# Patient Record
Sex: Male | Born: 2000 | Race: White | Hispanic: No | Marital: Single | Smoking: Never smoker
Health system: Southern US, Community
[De-identification: ages and names within clinical notes are randomized; demographics above are authoritative.]

---
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
## 2020-02-14 ENCOUNTER — Ambulatory Visit
Admission: EM | Admit: 2020-02-14 | Discharge: 2020-02-14 | Disposition: A | Discharge status: Home / Self Care | Payer: No Typology Code available for payment source | Attending: Family Medicine | Admitting: Family Medicine

## 2020-02-14 ENCOUNTER — Ambulatory Visit (INDEPENDENT_AMBULATORY_CARE_PROVIDER_SITE_OTHER): Payer: No Typology Code available for payment source

## 2020-02-14 ENCOUNTER — Other Ambulatory Visit: Payer: Self-pay

## 2020-02-14 DIAGNOSIS — S92355A Nondisplaced fracture of fifth metatarsal bone, left foot, initial encounter for closed fracture: Secondary | ICD-10-CM

## 2020-02-14 MED ORDER — HYDROCODONE-ACETAMINOPHEN 5-325 MG PO TABS: 1.0000 | ORAL_TABLET | Freq: Three times a day (TID) | ORAL | 0 refills | Status: DC | PRN

## 2020-02-14 MED ORDER — MELOXICAM 15 MG PO TABS: 15.0000 mg | ORAL_TABLET | Freq: Every day | ORAL | 0 refills | Status: DC

## 2020-02-14 NOTE — ED Provider Notes (Addendum)
[facility]    CSN: [id number] Arrival date & time: 02/14/20  1735 History   Chief Complaint Chief Complaint  Patient presents with  . Foot Pain   HPI   19 year old male presents with left foot pain and associated swelling of the foot and ankle.  Patient reports that he was playing basketball yesterday.  He states that he rolled his ankle/foot.  He states that he is having significant pain and swelling.  He localizes the pain to the lateral aspect of the foot.  He states that he is unable to bear weight.  Pain 7/10 in severity.  No relieving factors.  No other reported injuries.  No other complaints or concerns at this time.  Home Medications    Prior to Admission medications   Medication Sig Start Date End Date Taking? Authorizing Provider  HYDROcodone-acetaminophen (NORCO/VICODIN) 5-325 MG tablet Take 1 tablet by mouth every 8 (eight) hours as needed for moderate pain. 02/14/20   Coral Spikes, DO  meloxicam (MOBIC) 15 MG tablet Take 1 tablet (15 mg total) by mouth daily. 02/14/20   Coral Spikes, DO    Social History Social History   Tobacco Use  . Smoking status: Never Smoker  . Smokeless tobacco: Never Used  Substance Use Topics  . Alcohol use: Not Currently  . Drug use: Not Currently     Allergies   Patient has no known allergies.   Review of Systems Review of Systems  Constitutional: Negative.   Musculoskeletal:       Left foot pain and swelling; left ankle swelling.   Physical Exam Triage Vital Signs ED Triage Vitals  Enc Vitals Group     BP 02/14/20 1808 124/71     Pulse Rate 02/14/20 1808 (!) 59     Resp 02/14/20 1808 16     Temp 02/14/20 1808 97.9 F (36.6 C)     Temp Source 02/14/20 1808 Oral     SpO2 02/14/20 1808 99 %     Weight 02/14/20 1809 270 lb (122.5 kg)     Height 02/14/20 1809 6\' 2"  (1.88 m)     Head Circumference --      Peak Flow --      Pain Score 02/14/20 1808 6     Pain Loc --      Pain Edu? --      Excl. in [place]? --     Updated Vital Signs BP 124/71 (BP Location: Right Arm)   Pulse (!) 59   Temp 97.9 F (36.6 C) (Oral)   Resp 16   Ht 6\' 2"  (1.88 m)   Wt 122.5 kg   SpO2 99%   BMI 34.67 kg/m   Visual Acuity Right Eye Distance:   Left Eye Distance:   Bilateral Distance:    Right Eye Near:   Left Eye Near:    Bilateral Near:     Physical Exam Vitals and nursing note reviewed.  Constitutional:      General: He is not in acute distress.    Appearance: Normal appearance. He is obese. He is not ill-appearing.  HENT:     Head: Normocephalic and atraumatic.  Eyes:     General:        Right eye: No discharge.        Left eye: No discharge.     Conjunctiva/sclera: Conjunctivae normal.  Pulmonary:     Effort: Pulmonary effort is normal. No respiratory distress.  Musculoskeletal:     Comments:  Left ankle -lateral swelling noted.  No discrete areas of tenderness over the lateral aspect of the ankle.  Left foot -swelling noted laterally.  Exquisite tenderness over the fifth metatarsal.  Neurological:     Mental Status: He is alert.  Psychiatric:        Mood and Affect: Mood normal.        Behavior: Behavior normal.    UC Treatments / Results  Labs (all labs ordered are listed, but only abnormal results are displayed) Labs Reviewed - No data to display  EKG   Radiology DG Ankle Complete Left  Result Date: 02/14/2020 CLINICAL DATA:  Basketball injury yesterday with pain and swelling, initial encounter EXAM: LEFT ANKLE COMPLETE - 3+ VIEW COMPARISON:  None. FINDINGS: Soft tissue swelling is noted slightly greater laterally than medially. A tiny bony density is noted adjacent to the lateral malleolus which may represent a tiny avulsion. A an undisplaced fracture is noted at the base of the fifth metatarsal best seen on the lateral projection. No other focal abnormality is noted. IMPRESSION: Fracture at the base of the fifth metatarsal as well as findings suspicious for small avulsion from the  lateral malleolus. Associated soft tissue changes are seen. Electronically Signed   By: Alcide Clever M.D.   On: 02/14/2020 18:56   DG Foot Complete Left  Result Date: 02/14/2020 CLINICAL DATA:  Recent basketball injury with lateral foot pain, initial encounter EXAM: LEFT FOOT - COMPLETE 3+ VIEW COMPARISON:  None. FINDINGS: Fracture at the base of the fifth metatarsal is noted. No other fracture is seen. Lateral soft tissue swelling is noted. IMPRESSION: Fifth metatarsal fracture with soft tissue swelling. Electronically Signed   By: Alcide Clever M.D.   On: 02/14/2020 18:57    Procedures Procedures (including critical care time)  Medications Ordered in UC Medications - No data to display  Initial Impression / Assessment and Plan / UC Course  I have reviewed the triage vital signs and the nursing notes.  Pertinent labs & imaging results that were available during my care of the patient were reviewed by me and considered in my medical decision making (see chart for details).    19 year old male presents with a fracture of the fifth metatarsal and likely avulsion fracture of the lateral malleolus.  Acute complicated injury.  Placed in posterior splint.  Nonweightbearing.  Vicodin as needed for pain.  Meloxicam as directed.  Needs orthopedic follow-up.  Information given.  Final Clinical Impressions(s) / UC Diagnoses   Final diagnoses:  Closed nondisplaced fracture of fifth metatarsal bone of left foot, initial encounter     Discharge Instructions     Rest, elevation.  No weight bearing.  Medications as directed.  Please call [facility] clinic Orthopedics [phone]) OR [facility] [phone]) for an appt.  Take care  Dr. Adriana Simas     ED Prescriptions    Medication Sig Dispense Auth. Provider   meloxicam (MOBIC) 15 MG tablet Take 1 tablet (15 mg total) by mouth daily. 14 tablet Jesslynn Kruck G, DO   HYDROcodone-acetaminophen (NORCO/VICODIN) 5-325 MG tablet Take 1 tablet by  mouth every 8 (eight) hours as needed for moderate pain. 10 tablet Everlene Other G, DO     I have reviewed the PDMP during this encounter.    Tommie Sams, [place] 02/14/20 2045

## 2020-02-14 NOTE — Discharge Instructions (Signed)
Rest, elevation.  No weight bearing.  Medications as directed.  Please call [facility] clinic Orthopedics [phone]) OR [facility] [phone]) for an appt.  Take care  Dr. Adriana Simas

## 2020-02-14 NOTE — ED Triage Notes (Signed)
Pt states he was playing basketball yesterday on an uneven surface and twisted his left foot/ankle. Trouble bearing weight today an having lateral foot pain.

## 2021-06-27 IMAGING — CR DG ANKLE COMPLETE 3+V*L*
4 series · 4 of 4 positions shown · non-contrast
Comparison: None.

CLINICAL DATA: Basketball injury yesterday with pain and swelling,
initial encounter

EXAM:
LEFT ANKLE COMPLETE - 3+ VIEW

[ankle ap]
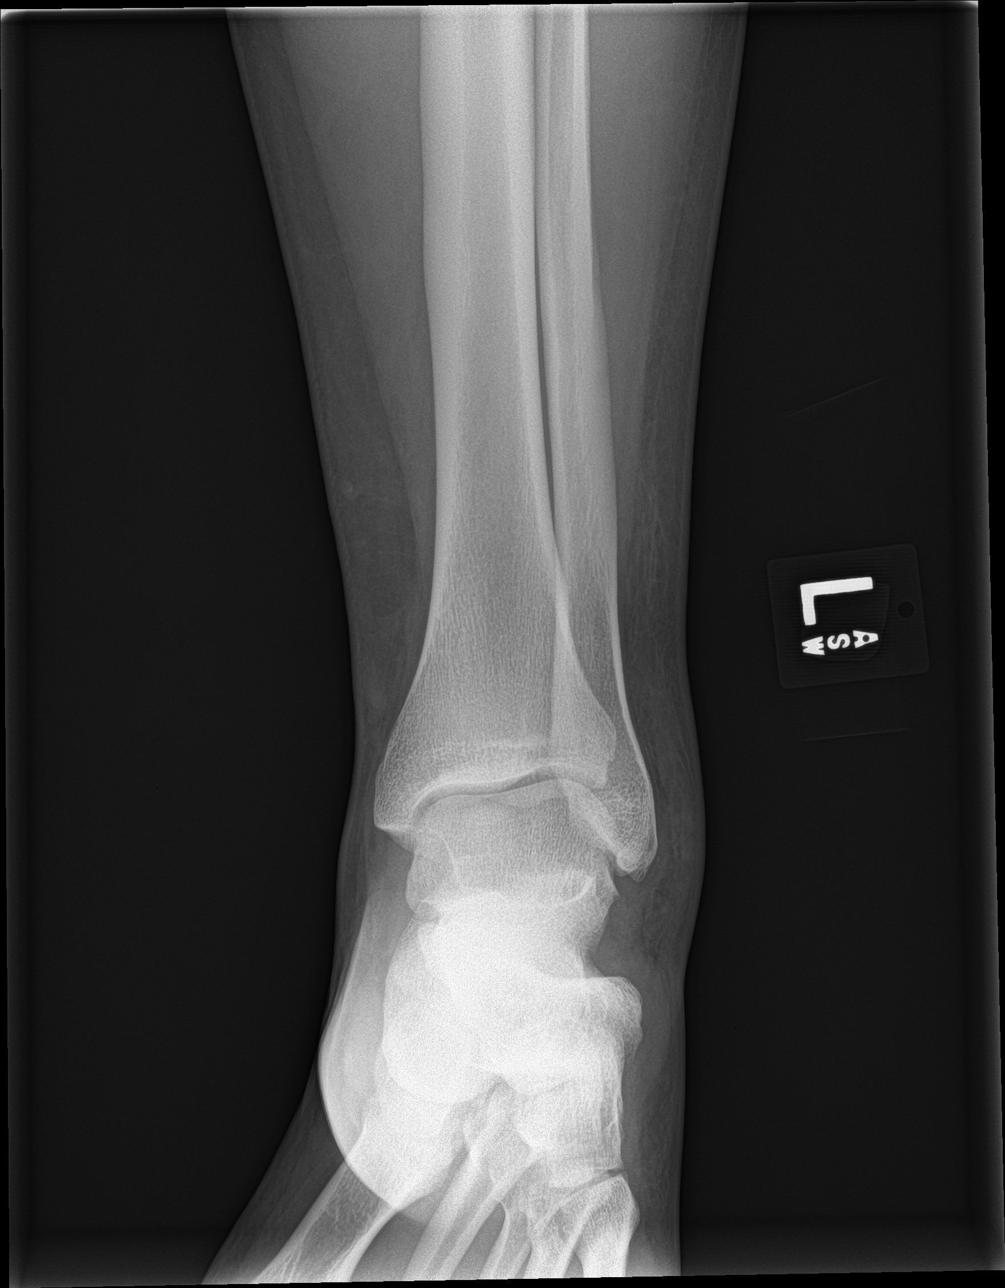

[ankle obl (1 of 2)]
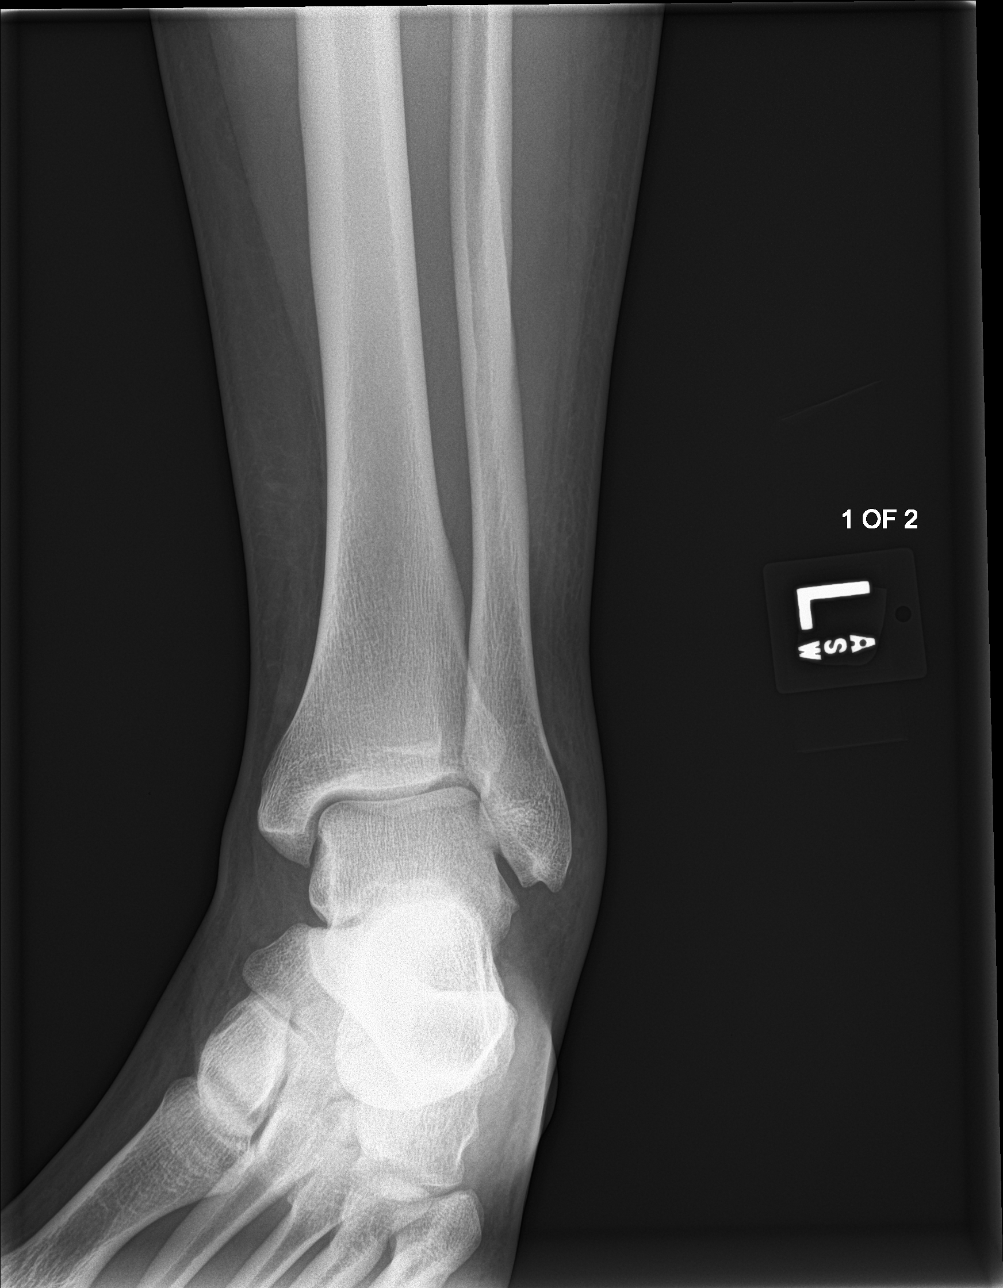

[ankle lat]
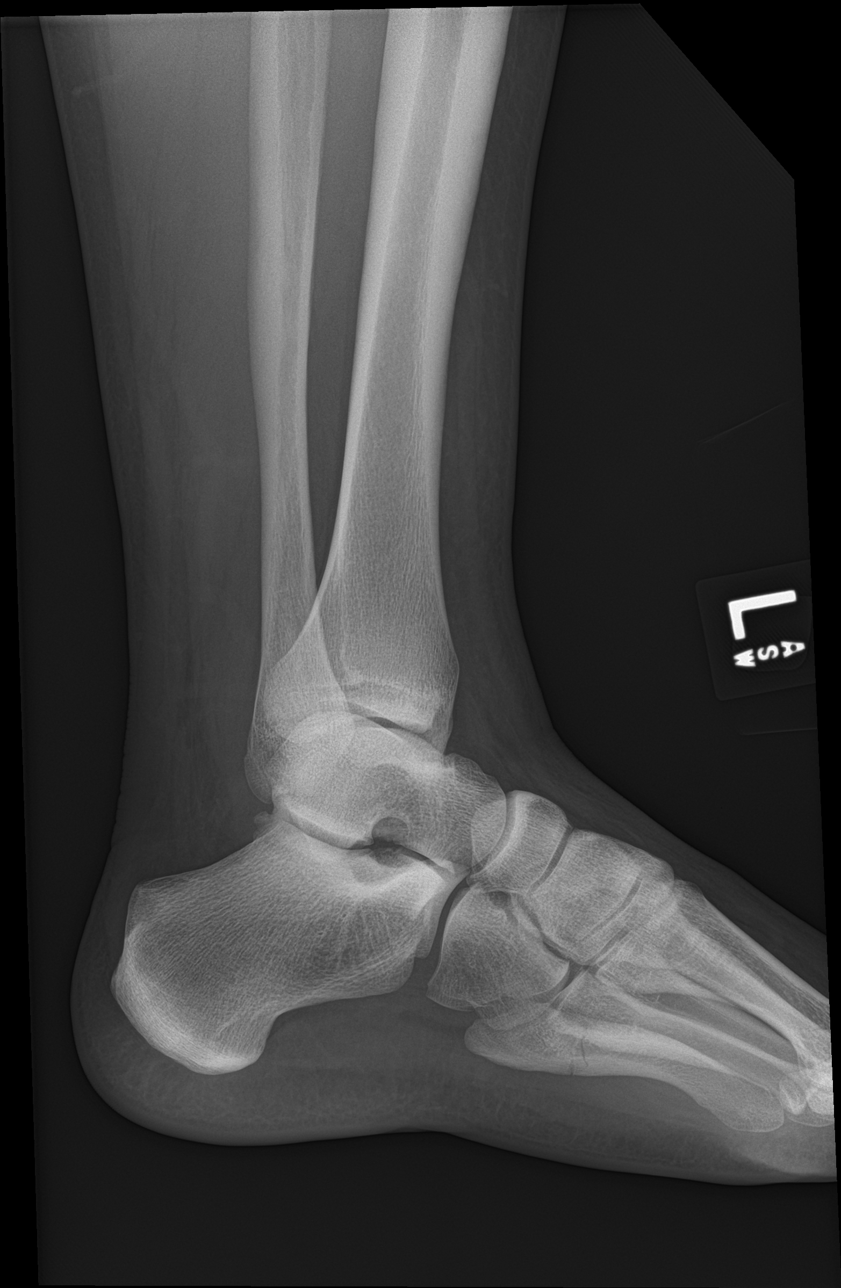

[ankle obl (2 of 2)]
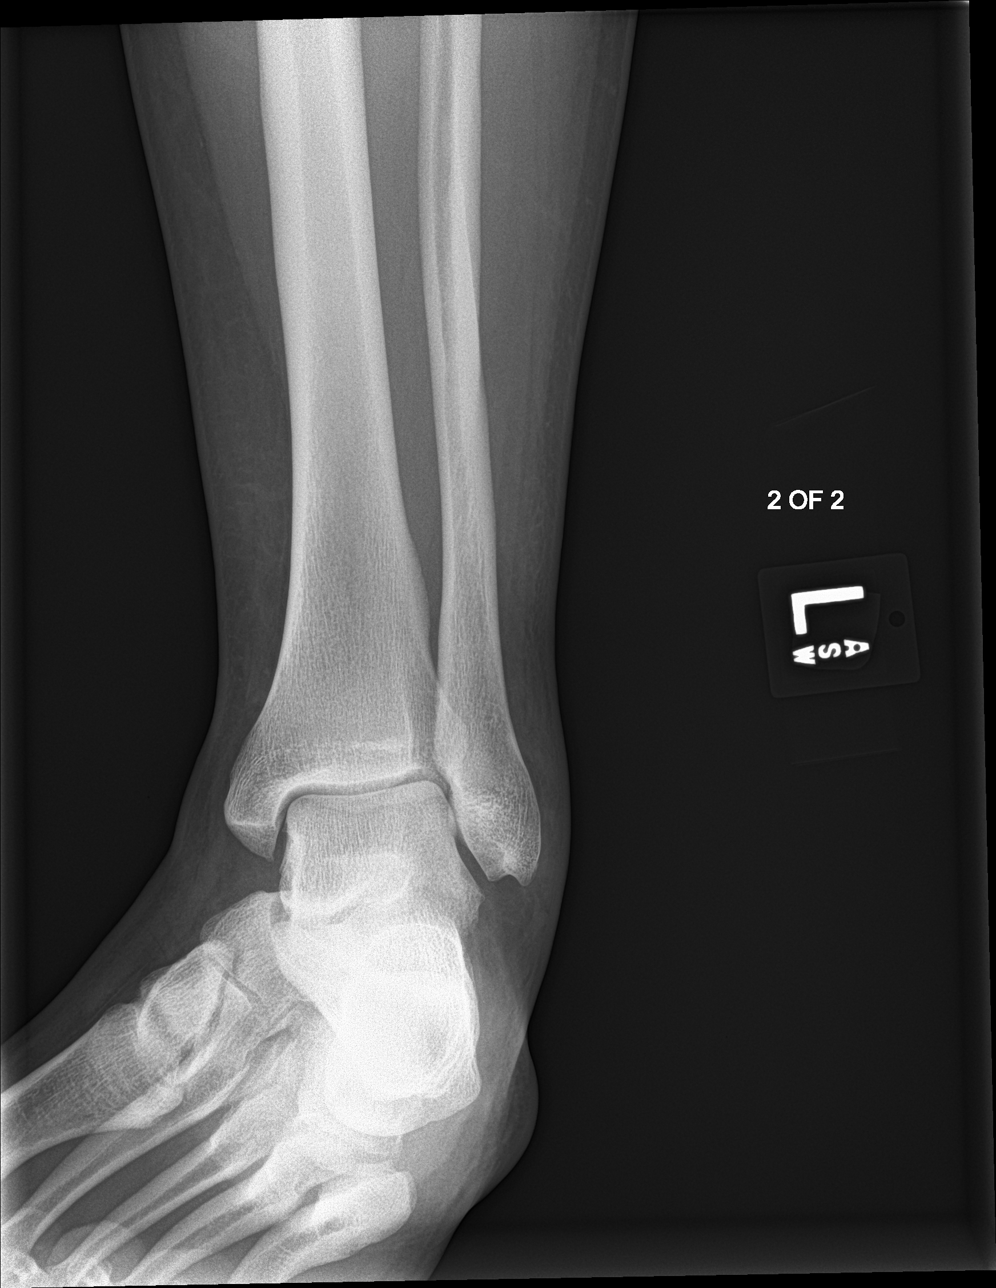

[4 of 4 positions shown; findings below may reference images not displayed]

FINDINGS: Soft tissue swelling is noted slightly greater laterally than
medially. A tiny bony density is noted adjacent to the lateral
malleolus which may represent a tiny avulsion. A an undisplaced
fracture is noted at the base of the fifth metatarsal best seen on
the lateral projection. No other focal abnormality is noted.
IMPRESSION: Fracture at the base of the fifth metatarsal as well as findings
suspicious for small avulsion from the lateral malleolus. Associated
soft tissue changes are seen.

## 2022-11-03 ENCOUNTER — Encounter: Payer: Self-pay | Admitting: Family Medicine

## 2022-11-03 ENCOUNTER — Ambulatory Visit: Payer: Self-pay | Admitting: Family Medicine

## 2022-11-03 DIAGNOSIS — Z113 Encounter for screening for infections with a predominantly sexual mode of transmission: Secondary | ICD-10-CM

## 2022-11-03 LAB — HM HIV SCREENING LAB: HM HIV Screening: NEGATIVE

## 2022-11-03 NOTE — Progress Notes (Signed)
[facility] Department STI clinic/screening visit  Subjective:  Chase Jefferson is a 22 y.o. male being seen today for an STI screening visit. The patient reports they do not have symptoms.    Patient has the following medical conditions:  There are no problems to display for this patient.    Chief Complaint  Patient presents with   Chase Jefferson states he is not having any symptoms     HPI  Patient reports to clinic for STI testing. States he was told a person he had sex with has HIV, states "this is a rumor".   Last HIV test per patient/review of record was No results found for: "HMHIVSCREEN" No results found for: "HIV"  Does the patient or their partner desires a pregnancy in the next year? No  Screening for MPX risk: Does the patient have an unexplained rash? No Is the patient MSM? No Does the patient endorse multiple sex partners or anonymous sex partners? No Did the patient have close or sexual contact with a person diagnosed with MPX? No Has the patient traveled outside the [place] where MPX is endemic? No Is there a high clinical suspicion for MPX-- evidenced by one of the following No  -Unlikely to be chickenpox  -Lymphadenopathy  -Rash that present in same phase of evolution on any given body part   See flowsheet for further details and programmatic requirements.   Immunization History  Administered Date(s) Administered   HPV Quadrivalent 06/04/2010, 08/06/2010, 10/28/2010   Hepatitis A, Ped/Adol-2 Dose 02/25/2006, 09/02/2006   Hepatitis B, PED/ADOLESCENT 08-14-01, 04/14/2001, 11/24/2001   Influenza,inj,Quad PF,6+ Mos 08/22/2019   MMR 02/09/2002, 02/25/2006   Meningococcal B, OMV 08/05/2017, 08/11/2018   Meningococcal Mcv4o 03/16/2012, 08/05/2017   Pneumococcal Conjugate PCV 7 02/09/2002, 05/11/2002   Tdap 07/25/2017   Varicella 02/09/2002, 02/25/2006     The following portions of the patient's history were  reviewed and updated as appropriate: allergies, current medications, past medical history, past social history, past surgical history and problem list.  Objective:  There were no vitals filed for this visit.  Physical Exam Vitals and nursing note reviewed.  Constitutional:      Appearance: Normal appearance.  HENT:     Head: Normocephalic and atraumatic.     Mouth/Throat:     Mouth: Mucous membranes are moist.     Pharynx: No oropharyngeal exudate or posterior oropharyngeal erythema.  Eyes:     General:        Right eye: No discharge.        Left eye: No discharge.     Conjunctiva/sclera:     Right eye: Right conjunctiva is not injected. No exudate.    Left eye: Left conjunctiva is not injected. No exudate. Pulmonary:     Effort: Pulmonary effort is normal.  Abdominal:     General: Abdomen is flat.     Palpations: Abdomen is soft. There is no hepatomegaly or mass.     Tenderness: There is no abdominal tenderness. There is no rebound.  Genitourinary:    Comments: Declined genital exam- no symptoms Lymphadenopathy:     Cervical: No cervical adenopathy.     Upper Body:     Right upper body: No supraclavicular or axillary adenopathy.     Left upper body: No supraclavicular or axillary adenopathy.  Skin:    General: Skin is warm and dry.  Neurological:     Mental Status: He is alert and oriented to person, place, and  time.       Assessment and Plan:  Chase Jefferson is a 22 y.o. male presenting to the [facility] Department for STI screening  1. Screening for venereal disease  - HIV [facility] - Syphilis Serology, [facility] - Chlamydia/GC NAA, Confirmation   Patient does not have STI symptoms Patient accepted all screenings including   Patient meets criteria for HepB screening? No. Ordered? not applicable Patient meets criteria for HepC screening? No. Ordered? not applicable Recommended condom use with all sex Discussed importance of condom use for  STI prevent  Treat per standing order Discussed time line for [facility] results and that patient will be called with positive results and encouraged patient to call if he had not heard in 2 weeks Recommended returning for continued or worsening symptoms.   Return if symptoms worsen or fail to improve.  Total time spent 15 minutes  Chase Jefferson, [place]

## 2022-11-03 NOTE — Progress Notes (Signed)
Pt is here for STD screening.  Condoms given.  Windle Guard, RN

## 2022-11-12 ENCOUNTER — Telehealth: Payer: Self-pay | Admitting: Family Medicine

## 2022-11-12 ENCOUNTER — Ambulatory Visit: Payer: Self-pay

## 2022-11-12 DIAGNOSIS — A749 Chlamydial infection, unspecified: Secondary | ICD-10-CM

## 2022-11-12 LAB — CHLAMYDIA/GC NAA, CONFIRMATION
Chlamydia trachomatis, NAA: POSITIVE — AB
Neisseria gonorrhoeae, NAA: NEGATIVE

## 2022-11-12 LAB — C. TRACHOMATIS NAA, CONFIRM: C. trachomatis NAA, Confirm: POSITIVE — AB

## 2022-11-12 MED ORDER — DOXYCYCLINE HYCLATE 100 MG PO TABS: 100.0000 mg | ORAL_TABLET | Freq: Two times a day (BID) | ORAL | 0 refills | Status: AC

## 2022-11-12 NOTE — Telephone Encounter (Signed)
Patient and Mother called concerned about his STI results. Would like someone to call back today if possible to talk about the results and if he needs treatment. Thank you!

## 2022-11-12 NOTE — Progress Notes (Signed)
Pt is here for treatment of Chlamydia.  The patient was dispensed Doxycycline 100 mg #14 today. I provided counseling today regarding the medication. We discussed the medication, the side effects and when to call clinic. Patient given the opportunity to ask questions. Questions answered.  Pt given Chlamydia brochure and condoms.  Windle Guard, RN

## 2024-03-07 ENCOUNTER — Ambulatory Visit
Admission: EM | Admit: 2024-03-07 | Discharge: 2024-03-07 | Disposition: A | Discharge status: Home / Self Care | Attending: Emergency Medicine | Admitting: Emergency Medicine

## 2024-03-07 DIAGNOSIS — H5789 Other specified disorders of eye and adnexa: Secondary | ICD-10-CM | POA: Diagnosis not present

## 2024-03-07 DIAGNOSIS — H1031 Unspecified acute conjunctivitis, right eye: Secondary | ICD-10-CM | POA: Diagnosis not present

## 2024-03-07 MED ORDER — POLYMYXIN B-TRIMETHOPRIM 10000-0.1 UNIT/ML-% OP SOLN: 1.0000 [drp] | Freq: Four times a day (QID) | OPHTHALMIC | 0 refills | Status: AC

## 2024-03-07 NOTE — ED Provider Notes (Signed)
 Chase Jefferson    CSN: [id number] Arrival date & time: 03/07/24  1444      History   Chief Complaint Chief Complaint  Patient presents with   Eye Problem    HPI Chase Jefferson is a 23 y.o. male.  Patient presents with right eye irritation, redness, tearing since yesterday.  He denies eye pain or change in vision.  No purulent drainage.  No eye trauma.  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  He sprays the pesticide and was sweating a lot.  He is concerned that the sweat may have carried some pesticide into his eye.  He wears safety goggles at work.  No sensation of foreign body.  No fever or chills.  Patient wears contact lenses.  The history is provided by the patient and medical records.    History reviewed. No pertinent past medical history.  There are no active problems to display for this patient.   History reviewed. No pertinent surgical history.     Home Medications    Prior to Admission medications   Medication Sig Start Date End Date Taking? Authorizing Provider  trimethoprim-polymyxin b (POLYTRIM) ophthalmic solution Place 1 drop into the right eye 4 (four) times daily for 7 days. 03/07/24 03/14/24 Yes Wellington Half, NP    Family History History reviewed. No pertinent family history.  Social History Social History   Tobacco Use   Smoking status: Never   Smokeless tobacco: Never  Vaping Use   Vaping status: Every Day  Substance Use Topics   Alcohol use: Not Currently   Drug use: Not Currently     Allergies   Patient has no known allergies.   Review of Systems Review of Systems  Constitutional:  Negative for chills and fever.  Eyes:  Positive for discharge and redness. Negative for pain and visual disturbance.  Skin:  Negative for color change, rash and wound.     Physical Exam Triage Vital Signs ED Triage Vitals  Encounter Vitals Group     BP 03/07/24 1521  134/82     Systolic BP Percentile --      Diastolic BP Percentile --      Pulse Rate 03/07/24 1521 (!) 56     Resp 03/07/24 1521 18     Temp 03/07/24 1521 (!) 97.1 F (36.2 C)     Temp src --      SpO2 03/07/24 1521 98 %     Weight --      Height --      Head Circumference --      Peak Flow --      Pain Score 03/07/24 1514 0     Pain Loc --      Pain Education --      Exclude from Growth Chart --    No data found.  Updated Vital Signs BP 134/82   Pulse (!) 56   Temp (!) 97.1 F (36.2 C)   Resp 18   SpO2 98%   Visual Acuity Right Eye Distance: 20/50 Left Eye Distance: 20/30 Bilateral Distance: 20/40  Right Eye Near:   Left Eye Near:    Bilateral Near:     Physical Exam Constitutional:      General: He is not in acute distress. HENT:     Mouth/Throat:     Mouth: Mucous membranes are moist.  Eyes:     General: Lids are  normal. Vision grossly intact.     Extraocular Movements: Extraocular movements intact.     Conjunctiva/sclera:     Right eye: Right conjunctiva is injected.     Left eye: Left conjunctiva is not injected.     Pupils: Pupils are equal, round, and reactive to light.     Right eye: No fluorescein uptake.  Cardiovascular:     Rate and Rhythm: Normal rate and regular rhythm.  Pulmonary:     Effort: Pulmonary effort is normal. No respiratory distress.  Neurological:     Mental Status: He is alert.      UC Treatments / Results  Labs (all labs ordered are listed, but only abnormal results are displayed) Labs Reviewed - No data to display  EKG   Radiology No results found.  Procedures Procedures (including critical care time)  Medications Ordered in UC Medications - No data to display  Initial Impression / Assessment and Plan / UC Course  I have reviewed the triage vital signs and the nursing notes.  Pertinent labs & imaging results that were available during my care of the patient were reviewed by me and considered in my medical  decision making (see chart for details).   Conjunctivitis and irritation of right eye.  Afebrile and vital signs are stable.  Patient removed his contact lenses here in the office.  His girlfriend will drive him home.  No fluorescein uptake noted on exam.  Treating with Polytrim eyedrops.  Instructed patient to follow-up with his eye care provider tomorrow.  ED precautions given.  Instructed patient to wear his glasses until he is cleared for wearing his contact lenses by his eye care doctor.  Work note provided.  Patient agrees to plan of care. Final Clinical Impressions(s) / UC Diagnoses   Final diagnoses:  Acute conjunctivitis of right eye, unspecified acute conjunctivitis type  Irritation of right eye     Discharge Instructions      Use the antibiotic eyedrops as prescribed.    Follow-up with your eye care provider.    Go to the emergency department if you have acute eye pain, changes in your vision, or other concerning symptoms.      ED Prescriptions     Medication Sig Dispense Auth. Provider   trimethoprim-polymyxin b (POLYTRIM) ophthalmic solution Place 1 drop into the right eye 4 (four) times daily for 7 days. 10 mL Wellington Half, NP      PDMP not reviewed this encounter.   Wellington Half, NP 03/07/24 1626

## 2024-03-07 NOTE — Discharge Instructions (Addendum)
 Use the antibiotic eyedrops as prescribed.    Follow-up with your eye care provider.    Go to the emergency department if you have acute eye pain, changes in your vision, or other concerning symptoms.

## 2024-03-07 NOTE — ED Triage Notes (Addendum)
 Patient to Urgent Care with complaints of right sided eye pain/ redness/ irritation. Denies any drainage. Woke up with some crusting around his eyelashes. States his eye started bothering him yesterday afternoon. Attempting to flush out his eye w/ water in the shower and used his contact solution.   Possibly got pesticides in his eye. 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

## 2024-10-19 ENCOUNTER — Ambulatory Visit
Admission: EM | Admit: 2024-10-19 | Discharge: 2024-10-19 | Disposition: A | Discharge status: Home / Self Care | Attending: Emergency Medicine | Admitting: Emergency Medicine

## 2024-10-19 ENCOUNTER — Encounter: Payer: Self-pay | Admitting: Emergency Medicine

## 2024-10-19 DIAGNOSIS — B349 Viral infection, unspecified: Secondary | ICD-10-CM | POA: Diagnosis not present

## 2024-10-19 LAB — POC SOFIA SARS ANTIGEN FIA: SARS Coronavirus 2 Ag: NEGATIVE

## 2024-10-19 MED ORDER — ONDANSETRON HCL 4 MG PO TABS: 4.0000 mg | ORAL_TABLET | Freq: Three times a day (TID) | ORAL | 0 refills | Status: AC | PRN

## 2024-10-19 NOTE — Discharge Instructions (Addendum)
 Your symptoms today are most likely being caused by a virus and should steadily improve in time it can take up to 7 to 10 days before you truly start to see a turnaround however things will get better  COVID test negative  You may use Zofran every 8 hours as needed for nausea, increase your fluid intake and to your inability to eat as normal to maintain your hydration    You can take Tylenol  and/or Ibuprofen as needed for fever reduction and pain relief.   For cough: honey 1/2 to 1 teaspoon (you can dilute the honey in water or another fluid).  You can also use guaifenesin and dextromethorphan for cough. You can use a humidifier for chest congestion and cough.  If you don't have a humidifier, you can sit in the bathroom with the hot shower running.      For sore throat: try warm salt water gargles, cepacol lozenges, throat spray, warm tea or water with lemon/honey, popsicles or ice, or OTC cold relief medicine for throat discomfort.   For congestion: take a daily anti-histamine like Zyrtec, Claritin, and a oral decongestant, such as pseudoephedrine.  You can also use Flonase 1-2 sprays in each nostril daily.   It is important to stay hydrated: drink plenty of fluids (water, gatorade/powerade/pedialyte, juices, or teas) to keep your throat moisturized and help further relieve irritation/discomfort.

## 2024-10-19 NOTE — ED Triage Notes (Signed)
 Patient complains of sore throat, nasal congestion,chills, and can't taste or smell  x 4 days. Patient has taken mucinex with mild relief.

## 2024-10-19 NOTE — ED Provider Notes (Signed)
 " CAY RALPH PELT    CSN: [id number] Arrival date & time: 10/19/24  9161      History   Chief Complaint Chief Complaint  Patient presents with   Chills   Sore Throat   Nasal Congestion    HPI Lanorris Kalisz is a 24 y.o. male.   Patient presents for evaluation of chills, nasal congestion, sore throat, nonproductive cough and nausea without vomiting beginning 4 days ago.  Has had decreased taste and smell.  No known sick contact in household.  Decreased intake due to persistent nausea.  Has attempted use of Mucinex.  Denies respiratory history, frequent vaping.  Denies shortness of breath or wheezing.     History reviewed. No pertinent past medical history.  There are no active problems to display for this patient.   History reviewed. No pertinent surgical history.     Home Medications    Prior to Admission medications  Not on File    Family History History reviewed. No pertinent family history.  Social History Social History[1]   Allergies   Patient has no known allergies.   Review of Systems Review of Systems  Constitutional:  Positive for chills. Negative for activity change, appetite change, diaphoresis, fatigue, fever and unexpected weight change.  HENT:  Positive for congestion and sore throat. Negative for dental problem, drooling, ear discharge, ear pain, facial swelling, hearing loss, mouth sores, nosebleeds, postnasal drip, rhinorrhea, sinus pressure, sinus pain, sneezing, tinnitus, trouble swallowing and voice change.   Respiratory:  Positive for cough. Negative for apnea, choking, chest tightness, shortness of breath, wheezing and stridor.   Gastrointestinal:  Positive for nausea. Negative for abdominal distention, abdominal pain, anal bleeding, blood in stool, constipation, diarrhea, rectal pain and vomiting.     Physical Exam Triage Vital Signs ED Triage Vitals  Encounter Vitals Group     BP 10/19/24 0940 (!) 142/87     Girls Systolic BP  Percentile --      Girls Diastolic BP Percentile --      Boys Systolic BP Percentile --      Boys Diastolic BP Percentile --      Pulse Rate 10/19/24 0940 71     Resp 10/19/24 0940 18     Temp 10/19/24 0940 97.8 F (36.6 C)     Temp Source 10/19/24 0940 Oral     SpO2 10/19/24 0940 96 %     Weight --      Height --      Head Circumference --      Peak Flow --      Pain Score 10/19/24 0944 0     Pain Loc --      Pain Education --      Exclude from Growth Chart --    No data found.  Updated Vital Signs BP (!) 142/87 (BP Location: Right Arm)   Pulse 71   Temp 97.8 F (36.6 C) (Oral)   Resp 18   SpO2 96%   Visual Acuity Right Eye Distance:   Left Eye Distance:   Bilateral Distance:    Right Eye Near:   Left Eye Near:    Bilateral Near:     Physical Exam Constitutional:      Appearance: Normal appearance.  HENT:     Right Ear: Tympanic membrane, ear canal and external ear normal.     Left Ear: Tympanic membrane, ear canal and external ear normal.     Nose: Congestion present.     Mouth/Throat:  Mouth: Mucous membranes are moist.     Pharynx: Oropharynx is clear. No oropharyngeal exudate or posterior oropharyngeal erythema.  Eyes:     Extraocular Movements: Extraocular movements intact.  Cardiovascular:     Rate and Rhythm: Normal rate and regular rhythm.     Pulses: Normal pulses.     Heart sounds: Normal heart sounds.  Pulmonary:     Effort: Pulmonary effort is normal.     Breath sounds: Normal breath sounds.  Neurological:     Mental Status: He is alert and oriented to person, place, and time. Mental status is at baseline.      UC Treatments / Results  Labs (all labs ordered are listed, but only abnormal results are displayed) Labs Reviewed  POC SOFIA SARS ANTIGEN FIA    EKG   Radiology No results found.  Procedures Procedures (including critical care time)  Medications Ordered in UC Medications - No data to display  Initial Impression  / Assessment and Plan / UC Course  I have reviewed the triage vital signs and the nursing notes.  Pertinent labs & imaging results that were available during my care of the patient were reviewed by me and considered in my medical decision making (see chart for details).  Viral illness  Patient is in no signs of distress nor toxic appearing.  Vital signs are stable.  Low suspicion for pneumonia, pneumothorax or bronchitis and therefore will defer imaging.  COVID test negative, flu testing unavailable.  Prescribe Zofran.May use additional over-the-counter medications as needed for supportive care.  May follow-up with urgent care as needed if symptoms persist or worsen.  Note given.   Final Clinical Impressions(s) / UC Diagnoses   Final diagnoses:  Viral illness   Discharge Instructions   None    ED Prescriptions   None    PDMP not reviewed this encounter.     [1]  Social History Tobacco Use   Smoking status: Never   Smokeless tobacco: Never  Vaping Use   Vaping status: Every Day  Substance Use Topics   Alcohol use: Not Currently   Drug use: Not Currently     Teresa Shelba SAUNDERS, NP 10/19/24 1018  "
